# Patient Record
Sex: Male | Born: 1995 | Race: White | Hispanic: No | Marital: Single | Smoking: Former smoker
Health system: Southern US, Community
[De-identification: ages and names within clinical notes are randomized; demographics above are authoritative.]

## PROBLEM LIST (undated history)

## (undated) DIAGNOSIS — F319 Bipolar disorder, unspecified: Secondary | ICD-10-CM

## (undated) DIAGNOSIS — F419 Anxiety disorder, unspecified: Secondary | ICD-10-CM

## (undated) DIAGNOSIS — F410 Panic disorder [episodic paroxysmal anxiety] without agoraphobia: Secondary | ICD-10-CM

---
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
## 2016-04-11 DIAGNOSIS — Z87891 Personal history of nicotine dependence: Secondary | ICD-10-CM | POA: Insufficient documentation

## 2016-04-11 DIAGNOSIS — F410 Panic disorder [episodic paroxysmal anxiety] without agoraphobia: Secondary | ICD-10-CM | POA: Insufficient documentation

## 2016-04-11 DIAGNOSIS — Z5181 Encounter for therapeutic drug level monitoring: Secondary | ICD-10-CM | POA: Insufficient documentation

## 2016-04-12 ENCOUNTER — Encounter: Payer: Self-pay | Admitting: Emergency Medicine

## 2016-04-12 ENCOUNTER — Emergency Department
Admission: EM | Admit: 2016-04-12 | Discharge: 2016-04-12 | Disposition: A | Discharge status: Home / Self Care | Payer: Self-pay | Attending: Emergency Medicine | Admitting: Emergency Medicine

## 2016-04-12 ENCOUNTER — Emergency Department: Payer: Self-pay

## 2016-04-12 DIAGNOSIS — F410 Panic disorder [episodic paroxysmal anxiety] without agoraphobia: Secondary | ICD-10-CM

## 2016-04-12 DIAGNOSIS — F419 Anxiety disorder, unspecified: Secondary | ICD-10-CM

## 2016-04-12 HISTORY — DX: Anxiety disorder, unspecified: F41.9

## 2016-04-12 HISTORY — DX: Panic disorder (episodic paroxysmal anxiety): F41.0

## 2016-04-12 LAB — URINE DRUG SCREEN, QUALITATIVE (ARMC ONLY)
Amphetamines, Ur Screen: NOT DETECTED
BARBITURATES, UR SCREEN: NOT DETECTED
BENZODIAZEPINE, UR SCRN: NOT DETECTED
Cannabinoid 50 Ng, Ur ~~LOC~~: POSITIVE — AB
Cocaine Metabolite,Ur ~~LOC~~: NOT DETECTED
MDMA (Ecstasy)Ur Screen: NOT DETECTED
METHADONE SCREEN, URINE: NOT DETECTED
Opiate, Ur Screen: NOT DETECTED
Phencyclidine (PCP) Ur S: NOT DETECTED
TRICYCLIC, UR SCREEN: NOT DETECTED

## 2016-04-12 LAB — COMPREHENSIVE METABOLIC PANEL
ALK PHOS: 85 U/L (ref 38–126)
ALT: 18 U/L (ref 17–63)
ANION GAP: 9 (ref 5–15)
AST: 23 U/L (ref 15–41)
Albumin: 5.1 g/dL — ABNORMAL HIGH (ref 3.5–5.0)
BILIRUBIN TOTAL: 1.2 mg/dL (ref 0.3–1.2)
BUN: 12 mg/dL (ref 6–20)
CALCIUM: 9.5 mg/dL (ref 8.9–10.3)
CO2: 25 mmol/L (ref 22–32)
Chloride: 103 mmol/L (ref 101–111)
Creatinine, Ser: 0.86 mg/dL (ref 0.61–1.24)
GFR calc non Af Amer: >60 mL/min (ref >60)
Glucose, Bld: 101 mg/dL — ABNORMAL HIGH (ref 65–99)
Potassium: 3.5 mmol/L (ref 3.5–5.1)
Sodium: 137 mmol/L (ref 135–145)
TOTAL PROTEIN: 8.3 g/dL — AB (ref 6.5–8.1)

## 2016-04-12 LAB — CBC
HEMATOCRIT: 39.9 % — AB (ref 40.0–52.0)
HEMOGLOBIN: 14.6 g/dL (ref 13.0–18.0)
MCH: 33.9 pg (ref 26.0–34.0)
MCHC: 36.6 g/dL — AB (ref 32.0–36.0)
MCV: 92.7 fL (ref 80.0–100.0)
Platelets: 189 10*3/uL (ref 150–440)
RBC: 4.31 MIL/uL — ABNORMAL LOW (ref 4.40–5.90)
RDW: 13.1 % (ref 11.5–14.5)
WBC: 13.5 10*3/uL — ABNORMAL HIGH (ref 3.8–10.6)

## 2016-04-12 LAB — TROPONIN I

## 2016-04-12 MED ORDER — LORAZEPAM 1 MG PO TABS
1.0000 mg | ORAL_TABLET | Freq: Once | ORAL | Status: AC
  Administered 2016-04-12: 1 mg via ORAL
  Filled 2016-04-12: qty 1

## 2016-04-12 MED ORDER — ESCITALOPRAM OXALATE 20 MG PO TABS: 20.0000 mg | ORAL_TABLET | Freq: Every day | ORAL | 0 refills | Status: AC

## 2016-04-12 NOTE — ED Notes (Signed)
Pt states chest pain x 3 years, worse when he takes a breath. Pt states he quit smoking 2 days ago. Pt seems anxious.

## 2016-04-12 NOTE — ED Provider Notes (Signed)
[facility] Emergency Department Provider Note  ____________________________________________   First MD Initiated Contact with Patient 04/12/16 0041     (approximate)  I have reviewed the triage vital signs and the nursing notes.   HISTORY  Chief Complaint Chest Pain and Anxiety    HPI Bernard Ward is a 20 y.o. male presents with "my anxiety and depression and killing me". Patient states that tonight he had a episode where he felt as though he could not breathe bilateral hand numbness and tingling feeling like I was panicking". Patient states that he's been trying to self medicate marijuana for his anxiety however he has not smoked any marijuana in 3 days. Patient also admits to cocaine and LSD approximately one month ago. Patient states that he constantly thinks about death and possibly of cancer as well as multiple other medical ailments.   Past Medical History:  Diagnosis Date  . Anxiety   . Panic attacks     Patient Active Problem List   Diagnosis Date Noted  . MIGRAINE-COMMON 09/19/2007    History reviewed. No pertinent surgical history.  Prior to Admission medications   Not on File    Allergies Review of patient's allergies indicates no known allergies.  History reviewed. No pertinent family history.  Social History Social History  Substance Use Topics  . Smoking status: Current Every Day Smoker  . Smokeless tobacco: Former Neurosurgeon  . Alcohol use Not on file    Review of Systems Constitutional: No fever/chills Eyes: No visual changes. ENT: No sore throat. Cardiovascular: Denies chest pain. Respiratory: Denies shortness of breath. Gastrointestinal: No abdominal pain.  No nausea, no vomiting.  No diarrhea.  No constipation. Genitourinary: Negative for dysuria. Musculoskeletal: Negative for back pain. Skin: Negative for rash. Neurological: Negative for headaches, focal weakness or numbness.  10-point ROS otherwise  negative.  ____________________________________________   PHYSICAL EXAM:  VITAL SIGNS: ED Triage Vitals [04/12/16 0003]  Enc Vitals Group     BP 112/66     Pulse Rate 94     Resp 18     Temp 98 F (36.7 C)     Temp src      SpO2 95 %     Weight 115 lb (52.2 kg)     Height 5\' 8"  (1.727 m)     Head Circumference      Peak Flow      Pain Score 8     Pain Loc      Pain Edu?      Excl. in GC?     Constitutional: Alert and oriented. Well appearing and in no acute distress. Eyes: Conjunctivae are normal. PERRL. EOMI. Head: Atraumatic. Ears:  Healthy appearing ear canals and TMs bilaterally Nose: No congestion/rhinnorhea. Mouth/Throat: Mucous membranes are moist.  Oropharynx non-erythematous. Neck: No stridor.  No meningeal signs.  Cardiovascular: Normal rate, regular rhythm. Good peripheral circulation. Grossly normal heart sounds.   Respiratory: Normal respiratory effort.  No retractions. Lungs CTAB. Gastrointestinal: Soft and nontender. No distention.  Genitourinary:  Musculoskeletal: No lower extremity tenderness nor edema. No gross deformities of extremities. Neurologic:  Normal speech and language. No gross focal neurologic deficits are appreciated.  Skin:  Skin is warm, dry and intact. No rash noted. Psychiatric: Very anxious affect. Speech and behavior are normal.  ____________________________________________   LABS (all labs ordered are listed, but only abnormal results are displayed)  Labs Reviewed  CBC - Abnormal; Notable for the following:  Result Value   WBC 13.5 (*)    RBC 4.31 (*)    HCT 39.9 (*)    MCHC 36.6 (*)    All other components within normal limits  COMPREHENSIVE METABOLIC PANEL - Abnormal; Notable for the following:    Glucose, Bld 101 (*)    Total Protein 8.3 (*)    Albumin 5.1 (*)    All other components within normal limits  TROPONIN I  URINE DRUG SCREEN, QUALITATIVE ([facility] ONLY)    ____________________________________________  EKG  ED ECG REPORT I, [place] Jacalynn Buzzell, the attending physician, personally viewed and interpreted this ECG.   Date: 04/12/2016  EKG Time: 12:11 AM  Rate: 80  Rhythm: Normal sinus rhythm  Axis: Normal  Intervals: Normal  ST&T Change: N  ____________________________________________  RADIOLOGY I, [place] Dewayne Shorter, personally viewed and evaluated these images (plain radiographs) as part of my medical decision making, as well as reviewing the written report by the radiologist.  Dg Chest Portable 1 View  Result Date: 04/12/2016 CLINICAL DATA:  Acute onset back pain while getting up from bed. EXAM: PORTABLE CHEST 1 VIEW COMPARISON:  Chest radiograph October 24, 2014 FINDINGS: Cardiomediastinal silhouette is normal. The lungs are clear without pleural effusions or focal consolidations. Trachea projects midline and there is no pneumothorax. Soft tissue planes and included osseous structures are non-suspicious. IMPRESSION: Normal chest. Electronically Signed   By: Awilda Metro M.D.   On: 04/12/2016 01:53     Procedures   ____________________________________________   INITIAL IMPRESSION / ASSESSMENT AND PLAN / ED COURSE  Pertinent labs & imaging results that were available during my care of the patient were reviewed by me and considered in my medical decision making (see chart for details).    Clinical Course   History of physical exam consistent with acute panic attack. Patient EKG unremarkable as well as chest x-ray. Patient advised to discontinue marijuana and all illicit drug use and follow-up with Dr. Edwin Dada ____________________________________________  FINAL CLINICAL IMPRESSION(S) / ED DIAGNOSES  Final diagnoses:  Anxiety  Panic attack     MEDICATIONS GIVEN DURING THIS VISIT:  Medications  LORazepam (ATIVAN) tablet 1 mg (1 mg Oral Given 04/12/16 0157)     NEW OUTPATIENT MEDICATIONS STARTED DURING THIS  VISIT:  New Prescriptions   No medications on file      Note:  This document was prepared using Dragon voice recognition software and may include unintentional dictation errors.    Darci Current, MD 04/12/16 [phone]

## 2016-04-12 NOTE — ED Notes (Signed)
Dr. Manson Passey at bedside.  Pt states he was lying in bed, "I didn't feel right, I couldn't catch my breath." Pt stood up and states he passed out. Mom states he has anxiety. Mom states he has been off anxiety pills x 2 years because he didn't like the way he felt. Pt talking fast, repeating self, talking with hands, seems anxious.  States back pain, R arm pain, tingling in R arm and sweating of R hand when syncopal episode occurred.

## 2016-04-12 NOTE — ED Notes (Signed)
Pt ambulatory to toilet

## 2016-04-12 NOTE — ED Triage Notes (Signed)
Patient brought in complaining of chest pain. Patient states that he feels like an elephant is sitting on his chest. Patient states that his anxiety and depression are killing him. Denies SI.

## 2016-04-12 NOTE — Progress Notes (Signed)
TTS spoke with patient.  TTS gathered information from patient on his symptoms and recent behaviors.  He reports that he has anxiety and depression.  He reports not receiving psychiatric services.  He states that he has been receiving his medications from his primary care physician. He  Reports having an appointment with him tomorrow.  TTS provided information for [facility] an other care agencies to the patient.

## 2016-04-13 ENCOUNTER — Encounter: Payer: Self-pay | Admitting: Emergency Medicine

## 2016-04-13 ENCOUNTER — Emergency Department
Admission: EM | Admit: 2016-04-13 | Discharge: 2016-04-13 | Disposition: A | Discharge status: Home / Self Care | Payer: Self-pay | Attending: Emergency Medicine | Admitting: Emergency Medicine

## 2016-04-13 DIAGNOSIS — Z87891 Personal history of nicotine dependence: Secondary | ICD-10-CM | POA: Insufficient documentation

## 2016-04-13 DIAGNOSIS — J029 Acute pharyngitis, unspecified: Secondary | ICD-10-CM | POA: Insufficient documentation

## 2016-04-13 LAB — POCT RAPID STREP A: STREPTOCOCCUS, GROUP A SCREEN (DIRECT): NEGATIVE

## 2016-04-13 MED ORDER — AMOXICILLIN 500 MG PO TABS
500.0000 mg | ORAL_TABLET | Freq: Two times a day (BID) | ORAL | 0 refills | Status: DC
Start: 2016-04-13 — End: 2016-10-04

## 2016-04-13 MED ORDER — LIDOCAINE VISCOUS 2 % MT SOLN: 20.0000 mL | OROMUCOSAL | 0 refills | Status: DC | PRN

## 2016-04-13 MED ORDER — LIDOCAINE VISCOUS 2 % MT SOLN
15.0000 mL | Freq: Once | OROMUCOSAL | Status: AC
  Administered 2016-04-13: 15 mL via OROMUCOSAL
  Filled 2016-04-13: qty 15

## 2016-04-13 MED ORDER — AMOXICILLIN 500 MG PO CAPS
500.0000 mg | ORAL_CAPSULE | Freq: Once | ORAL | Status: AC
  Administered 2016-04-13: 500 mg via ORAL
  Filled 2016-04-13: qty 1

## 2016-04-13 NOTE — ED Provider Notes (Signed)
[facility] Emergency Department Provider Note  ____________________________________________  Time seen: Approximately 10:04 PM  I have reviewed the triage vital signs and the nursing notes.   HISTORY  Chief Complaint Sore Throat and Fever    HPI Bernard Ward is a 20 y.o. male presents for evaluation of sore throat times one day. Patient states he's had a low-grade fever at home. Hurts to swallow.   Past Medical History:  Diagnosis Date  . Anxiety   . Panic attacks     Patient Active Problem List   Diagnosis Date Noted  . MIGRAINE-COMMON 09/19/2007    History reviewed. No pertinent surgical history.  Prior to Admission medications   Medication Sig Start Date End Date Taking? Authorizing Provider  amoxicillin (AMOXIL) 500 MG tablet Take 1 tablet (500 mg total) by mouth 2 (two) times daily. 04/13/16   Charmayne Sheer Madalen Gavin, PA-C  escitalopram (LEXAPRO) 20 MG tablet Take 1 tablet (20 mg total) by mouth daily. 04/12/16 05/12/16  Darci Current, MD  lidocaine (XYLOCAINE) 2 % solution Use as directed 20 mLs in the mouth or throat as needed for mouth pain. 04/13/16   Evangeline Dakin, PA-C    Allergies Review of patient's allergies indicates no known allergies.  No family history on file.  Social History Social History  Substance Use Topics  . Smoking status: Former Smoker    Types: Cigarettes  . Smokeless tobacco: Former Neurosurgeon     Comment: currently using vapor cigarettes only  . Alcohol use No    Review of Systems Constitutional: No fever/chills ENT: Positive sore throat Cardiovascular: Denies chest pain. Respiratory: Denies shortness of breath. Musculoskeletal: Negative for back pain. Skin: Negative for rash. Neurological: Negative for headaches, focal weakness or numbness.  10-point ROS otherwise negative.  ____________________________________________   PHYSICAL EXAM:  VITAL SIGNS: ED Triage Vitals  Enc Vitals Group     BP 04/13/16  2134 123/81     Pulse Rate 04/13/16 2134 80     Resp 04/13/16 2134 18     Temp 04/13/16 2134 98.7 F (37.1 C)     Temp Source 04/13/16 2134 Oral     SpO2 04/13/16 2134 99 %     Weight 04/13/16 2134 115 lb (52.2 kg)     Height 04/13/16 2134  (1.727 m)     Head Circumference --      Peak Flow --      Pain Score 04/13/16 2135 10     Pain Loc --      Pain Edu? --      Excl. in GC? --     Constitutional: Alert and oriented. Well appearing and in no acute distress. Nose: No congestion/rhinnorhea. Mouth/Throat: Mucous membranes are moist.  Oropharynx erythematousWith mild tonsillar enlargement. No exudate noted.. Neck: No stridor. Supple, full range of motion.  Cardiovascular: Normal rate, regular rhythm. Grossly normal heart sounds.  Good peripheral circulation. Respiratory: Normal respiratory effort.  No retractions. Lungs CTAB. Musculoskeletal: No lower extremity tenderness nor edema.  No joint effusions. Neurologic:  Normal speech and language. No gross focal neurologic deficits are appreciated. No gait instability. Skin:  Skin is warm, dry and intact. No rash noted. Psychiatric: Mood and affect are normal. Speech and behavior are normal.  ____________________________________________   LABS (all labs ordered are listed, but only abnormal results are displayed)  Labs Reviewed  CULTURE, GROUP A STREP [facility])  POCT RAPID STREP A   ____________________________________________  EKG   ____________________________________________  RADIOLOGY   ____________________________________________   PROCEDURES  Procedure(s) performed: None  Critical Care performed: No  ____________________________________________   INITIAL IMPRESSION / ASSESSMENT AND PLAN / ED COURSE  Pertinent labs & imaging results that were available during my care of the patient were reviewed by me and considered in my medical decision making (see chart for details).  Acute pharyngitis. Rx given for  amoxicillin and viscous lidocaine. Patient follow-up with PCP or return to ER with any worsening symptomology.  Clinical Course    ____________________________________________   FINAL CLINICAL IMPRESSION(S) / ED DIAGNOSES  Final diagnoses:  Sore throat  Acute pharyngitis, unspecified etiology     This chart was dictated using voice recognition software/Dragon. Despite best efforts to proofread, errors can occur which can change the meaning. Any change was purely unintentional.    Evangeline Dakin, PA-C 04/13/16 2233    Phineas Semen, MD 04/13/16 [phone]

## 2016-04-13 NOTE — ED Triage Notes (Signed)
Patient states was seen in ED a few days ago for anxiety.  Yesterday felt like throat was sore.  Today just feeling weak and feverish.

## 2016-04-13 NOTE — ED Notes (Signed)
Pt states throat is hurting, states he is not able to swallow food and it hurts to swallow liquid. "Marveen Reeks gave me an amoxicillin." Pt friend states he has had some dizziness.   Hx of anxiety, seen for anxiety Monday night, started on new medicine that pt states is helping.

## 2016-04-16 LAB — CULTURE, GROUP A STREP (THRC)

## 2016-10-02 ENCOUNTER — Emergency Department
Admission: EM | Admit: 2016-10-02 | Discharge: 2016-10-02 | Disposition: A | Discharge status: Home / Self Care | Payer: Self-pay | Attending: Emergency Medicine | Admitting: Emergency Medicine

## 2016-10-02 DIAGNOSIS — Z87891 Personal history of nicotine dependence: Secondary | ICD-10-CM | POA: Insufficient documentation

## 2016-10-02 DIAGNOSIS — B349 Viral infection, unspecified: Secondary | ICD-10-CM | POA: Insufficient documentation

## 2016-10-02 LAB — CBC
HCT: 43.2 % (ref 40.0–52.0)
HEMOGLOBIN: 15.6 g/dL (ref 13.0–18.0)
MCH: 33.9 pg (ref 26.0–34.0)
MCHC: 36 g/dL (ref 32.0–36.0)
MCV: 94.1 fL (ref 80.0–100.0)
Platelets: 203 10*3/uL (ref 150–440)
RBC: 4.59 MIL/uL (ref 4.40–5.90)
RDW: 13.2 % (ref 11.5–14.5)
WBC: 10 10*3/uL (ref 3.8–10.6)

## 2016-10-02 LAB — COMPREHENSIVE METABOLIC PANEL
ALT: 20 U/L (ref 17–63)
ANION GAP: 9 (ref 5–15)
AST: 27 U/L (ref 15–41)
Albumin: 4.5 g/dL (ref 3.5–5.0)
Alkaline Phosphatase: 86 U/L (ref 38–126)
BUN: 15 mg/dL (ref 6–20)
CHLORIDE: 103 mmol/L (ref 101–111)
CO2: 24 mmol/L (ref 22–32)
Calcium: 9.2 mg/dL (ref 8.9–10.3)
Creatinine, Ser: 0.81 mg/dL (ref 0.61–1.24)
GFR calc non Af Amer: >60 mL/min (ref >60)
Glucose, Bld: 104 mg/dL — ABNORMAL HIGH (ref 65–99)
Potassium: 3.9 mmol/L (ref 3.5–5.1)
SODIUM: 136 mmol/L (ref 135–145)
Total Bilirubin: 1 mg/dL (ref 0.3–1.2)
Total Protein: 7.7 g/dL (ref 6.5–8.1)

## 2016-10-02 LAB — LIPASE, BLOOD: LIPASE: 18 U/L (ref 11–51)

## 2016-10-02 MED ORDER — KETOROLAC TROMETHAMINE 30 MG/ML IJ SOLN
30.0000 mg | Freq: Once | INTRAMUSCULAR | Status: AC
  Administered 2016-10-02: 30 mg via INTRAVENOUS
  Filled 2016-10-02: qty 1

## 2016-10-02 MED ORDER — ONDANSETRON 4 MG PO TBDP
4.0000 mg | ORAL_TABLET | Freq: Once | ORAL | Status: AC | PRN
  Administered 2016-10-02: 4 mg via ORAL

## 2016-10-02 MED ORDER — SODIUM CHLORIDE 0.9 % IV BOLUS (SEPSIS)
1000.0000 mL | Freq: Once | INTRAVENOUS | Status: AC
Start: 2016-10-02 — End: 2016-10-02
  Administered 2016-10-02: 1000 mL via INTRAVENOUS

## 2016-10-02 MED ORDER — LOPERAMIDE HCL 2 MG PO TABS: 2.0000 mg | ORAL_TABLET | Freq: Four times a day (QID) | ORAL | 0 refills | Status: DC | PRN

## 2016-10-02 MED ORDER — ONDANSETRON HCL 4 MG/2ML IJ SOLN
4.0000 mg | Freq: Once | INTRAMUSCULAR | Status: AC
  Administered 2016-10-02: 4 mg via INTRAVENOUS
  Filled 2016-10-02: qty 2

## 2016-10-02 MED ORDER — ONDANSETRON 4 MG PO TBDP
ORAL_TABLET | ORAL | Status: AC
  Filled 2016-10-02: qty 1

## 2016-10-02 MED ORDER — PROMETHAZINE HCL 25 MG PO TABS: 25.0000 mg | ORAL_TABLET | Freq: Four times a day (QID) | ORAL | 0 refills | Status: DC | PRN

## 2016-10-02 NOTE — ED Triage Notes (Signed)
Pt c/o cough, body aches, congestion, fatigue and diarrhea X 2 days.

## 2016-10-02 NOTE — Discharge Instructions (Signed)
Please use Tylenol or Motrin every 6 hours as needed for discomfort or fever. Please drink plenty of fluids and take a nausea medication as needed, as written. You have also been prescribed an antidiarrheal medication starting tomorrow please take this medication as needed for diarrhea. Obtain plenty of rest, drink plenty of fluids, follow-up with your primary care doctor in 2-3 days for recheck/reevaluation unless improved. Return to the emergency department for any significant abdominal pain, unable to keep down fluids to keep vomiting, or any other symptom personally concerning to yourself.

## 2016-10-02 NOTE — ED Notes (Signed)
Pt verbalized understanding of discharge instructions. NAD at this time. 

## 2016-10-02 NOTE — ED Provider Notes (Signed)
[facility] Emergency Department Provider Note  Time seen: 5:09 PM  I have reviewed the triage vital signs and the nursing notes.   HISTORY  Chief Complaint Generalized Body Aches; URI; Cough; and Diarrhea    HPI Bernard Ward is a 21 y.o. male with a past medical history of anxiety who presents to the emergency department with abdominal discomfort nausea vomiting and diarrhea. According to the patient for the past 2 days he has felt very weak and fatigued. This morning he thought a burning sensation in his abdomen followed by several episodes of nausea and vomiting and since then has developed diarrhea. States diffuse abdominal cramping discomfort. Denies any chest pain or trouble breathing. Denies any fever.  Past Medical History:  Diagnosis Date  . Anxiety   . Panic attacks     Patient Active Problem List   Diagnosis Date Noted  . MIGRAINE-COMMON 09/19/2007    History reviewed. No pertinent surgical history.  Prior to Admission medications   Medication Sig Start Date End Date Taking? Authorizing Provider  amoxicillin (AMOXIL) 500 MG tablet Take 1 tablet (500 mg total) by mouth 2 (two) times daily. 04/13/16   Charmayne Sheer Beers, PA-C  escitalopram (LEXAPRO) 20 MG tablet Take 1 tablet (20 mg total) by mouth daily. 04/12/16 05/12/16  Darci Current, MD  lidocaine (XYLOCAINE) 2 % solution Use as directed 20 mLs in the mouth or throat as needed for mouth pain. 04/13/16   Evangeline Dakin, PA-C    No Known Allergies  No family history on file.  Social History Social History  Substance Use Topics  . Smoking status: Former Smoker    Types: Cigarettes  . Smokeless tobacco: Former Neurosurgeon     Comment: currently using vapor cigarettes only  . Alcohol use No    Review of Systems Constitutional: Negative for fever. Cardiovascular: Negative for chest pain. Respiratory: Negative for shortness of breath. Gastrointestinal:Positive for abdominal cramping, nausea  vomiting and diarrhea. Neurological: Negative for headache 10-point ROS otherwise negative.  ____________________________________________   PHYSICAL EXAM:  VITAL SIGNS: ED Triage Vitals  Enc Vitals Group     BP 10/02/16 1542 117/78     Pulse Rate 10/02/16 1542 80     Resp 10/02/16 1542 18     Temp 10/02/16 1542 98.2 F (36.8 C)     Temp Source 10/02/16 1542 Oral     SpO2 10/02/16 1542 99 %     Weight 10/02/16 1542 120 lb (54.4 kg)     Height 10/02/16 1542 5\' 10"  (1.778 m)     Head Circumference --      Peak Flow --      Pain Score 10/02/16 1543 8     Pain Loc --      Pain Edu? --      Excl. in GC? --     Constitutional: Alert and oriented. Well appearing and in no distress. Eyes: Normal exam ENT   Head: Normocephalic and atraumatic.   Mouth/Throat: Mucous membranes are moist. Cardiovascular: Normal rate, regular rhythm. No murmur Respiratory: Normal respiratory effort without tachypnea nor retractions. Breath sounds are clear Gastrointestinal: Soft, mild diffuse abdominal tenderness, no focal tenderness identified. No distention. Hyperactive bowel sounds. Musculoskeletal: Nontender with normal range of motion in all extremities Neurologic:  Normal speech and language. No gross focal neurologic deficits Skin:  Skin is warm, dry and intact.  Psychiatric: Mood and affect are normal.   ____________________________________________    INITIAL IMPRESSION / ASSESSMENT  AND PLAN / ED COURSE  Pertinent labs & imaging results that were available during my care of the patient were reviewed by me and considered in my medical decision making (see chart for details).  The patient presents to the emergency department with nausea vomiting diarrhea and abdominal cramping. Patient's symptoms are very suggestive of a viral process such as nor a virus. No fever, afebrile in the emergency department. Labs are largely within normal limits. We will treat symptomatically with fluids,  Zofran and Toradol in the emergency department. Anticipate likely discharge home with loperamide and Phenergan. I discussed supportive care at home as well as by normal abdominal pain return precautions.  Patient continues to appear well, better after medications. We will discharge home with supportive care.  ____________________________________________   FINAL CLINICAL IMPRESSION(S) / ED DIAGNOSES  Viral syndrome    Minna AntisKevin Nathanal Hermiz, MD 10/02/16 [phone]

## 2016-10-03 ENCOUNTER — Telehealth: Payer: Self-pay

## 2016-10-03 NOTE — Telephone Encounter (Signed)
Left message to call office to see how he was doing after his ER visit for weakness

## 2016-10-04 ENCOUNTER — Emergency Department: Payer: Self-pay

## 2016-10-04 ENCOUNTER — Encounter: Payer: Self-pay | Admitting: Emergency Medicine

## 2016-10-04 ENCOUNTER — Emergency Department
Admission: EM | Admit: 2016-10-04 | Discharge: 2016-10-04 | Disposition: A | Discharge status: Home / Self Care | Payer: Self-pay | Attending: Emergency Medicine | Admitting: Emergency Medicine

## 2016-10-04 DIAGNOSIS — J40 Bronchitis, not specified as acute or chronic: Secondary | ICD-10-CM | POA: Insufficient documentation

## 2016-10-04 DIAGNOSIS — Z87891 Personal history of nicotine dependence: Secondary | ICD-10-CM | POA: Insufficient documentation

## 2016-10-04 LAB — POCT RAPID STREP A: Streptococcus, Group A Screen (Direct): NEGATIVE

## 2016-10-04 LAB — INFLUENZA PANEL BY PCR (TYPE A & B)
Influenza A By PCR: NEGATIVE
Influenza B By PCR: NEGATIVE

## 2016-10-04 MED ORDER — PREDNISONE 10 MG (21) PO TBPK: ORAL_TABLET | ORAL | 0 refills | Status: AC

## 2016-10-04 MED ORDER — DOXYCYCLINE HYCLATE 50 MG PO CAPS: 100.0000 mg | ORAL_CAPSULE | Freq: Two times a day (BID) | ORAL | 0 refills | Status: AC

## 2016-10-04 NOTE — ED Notes (Signed)
Pt discharged home after verbalizing understanding of discharge instructions; nad noted. 

## 2016-10-04 NOTE — ED Provider Notes (Signed)
[facility] Emergency Department Provider Note  ____________________________________________  Time seen: Approximately 3:57 PM  I have reviewed the triage vital signs and the nursing notes.   HISTORY  Chief Complaint Sore Throat    HPI Bernard Ward is a 21 y.o. male that presents to emergency department with sore throat, cough for 1 week. Patient is concerned for strep throat as he had it over the summer and had a peritonsillar abscess that needed to be drained over the summer. Patient has not taken anything for symptoms. Patient states that he was vomiting and had diarrhea a couple days ago, but this has resolved. Patient denies fever, voice change, chills, muscle aches, difficulty swallowing, shortness of breath, chest pain.   Past Medical History:  Diagnosis Date  . Anxiety   . Panic attacks     Patient Active Problem List   Diagnosis Date Noted  . MIGRAINE-COMMON 09/19/2007    History reviewed. No pertinent surgical history.  Prior to Admission medications   Medication Sig Start Date End Date Taking? Authorizing Provider  doxycycline (VIBRAMYCIN) 50 MG capsule Take 2 capsules (100 mg total) by mouth 2 (two) times daily. 10/04/16 10/11/16  Enid DerryAshley Jad Johansson, PA-C  escitalopram (LEXAPRO) 20 MG tablet Take 1 tablet (20 mg total) by mouth daily. 04/12/16 05/12/16  Darci Currentandolph N Brown, MD  predniSONE (STERAPRED UNI-PAK 21 TAB) 10 MG (21) TBPK tablet Take 6 tablets on day 1, take 5 tablets on day 2, take 4 tablets on day 3, take 3 tablets on day 4, take 2 tablets on day 5, take 1 tablet on day 6 10/04/16   Enid DerryAshley Emmarae Cowdery, PA-C    Allergies Patient has no known allergies.  No family history on file.  Social History Social History  Substance Use Topics  . Smoking status: Former Smoker    Types: Cigarettes  . Smokeless tobacco: Former NeurosurgeonUser     Comment: currently using vapor cigarettes only  . Alcohol use No     Review of Systems  Constitutional: No  fever/chills Cardiovascular: No chest pain. Respiratory:  No SOB. Gastrointestinal: No abdominal pain.  No nausea, no vomiting.  Musculoskeletal: Negative for musculoskeletal pain. Skin: Negative for rash, abrasions, lacerations, ecchymosis. Neurological: Negative for headaches, numbness or tingling   ____________________________________________   PHYSICAL EXAM:  VITAL SIGNS: ED Triage Vitals [10/04/16 1342]  Enc Vitals Group     BP 98/61     Pulse Rate 80     Resp 16     Temp 98.4 F (36.9 C)     Temp Source Oral     SpO2 99 %     Weight 119 lb (54 kg)     Height 5\' 9"  (1.753 m)     Head Circumference      Peak Flow      Pain Score 8     Pain Loc      Pain Edu?      Excl. in GC?      Constitutional: Alert and oriented. Well appearing and in no acute distress. Eyes: Conjunctivae are normal. PERRL. EOMI. Head: Atraumatic. ENT:      Ears:      Nose: No congestion/rhinnorhea.      Mouth/Throat: Mucous membranes are moist. Tonsils not enlarged. No hypertrophy. Tonsils are symmetric. Uvula midline. Oropharynx non-erythematous. No exudates. Soft palate rises symmetrically.  Neck: No stridor.   Cardiovascular: Normal rate, regular rhythm. Normal S1 and S2.  Good peripheral circulation. Respiratory: Normal respiratory effort without tachypnea  or retractions. Lungs CTAB. Good air entry to the bases with no decreased or absent breath sounds. Gastrointestinal: Bowel sounds 4 quadrants. Soft and nontender to palpation. No guarding or rigidity. No palpable masses. No distention. Musculoskeletal: Full range of motion to all extremities. No gross deformities appreciated. Neurologic:  Normal speech and language. No gross focal neurologic deficits are appreciated.  Skin:  Skin is warm, dry and intact. No rash noted.   ____________________________________________   LABS (all labs ordered are listed, but only abnormal results are displayed)  Labs Reviewed  INFLUENZA PANEL BY  PCR (TYPE A & B)  POCT RAPID STREP A   ____________________________________________  EKG   ____________________________________________  RADIOLOGY Lexine Baton, personally viewed and evaluated these images (plain radiographs) as part of my medical decision making, as well as reviewing the written report by the radiologist.  Dg Chest 2 View  Result Date: 10/04/2016 CLINICAL DATA:  Sore throat. History of tonsillar abscess. Right-sided chest pain EXAM: CHEST  2 VIEW COMPARISON:  April 12, 2016 FINDINGS: Lungs are clear. Heart size and pulmonary vascularity are normal. No adenopathy. No pneumothorax. No bone lesions. There is slight lower thoracic levoscoliosis. IMPRESSION: No edema or consolidation. Electronically Signed   By: Bretta Bang III M.D.   On: 10/04/2016 15:01    ____________________________________________    PROCEDURES  Procedure(s) performed:    Procedures    Medications - No data to display   ____________________________________________   INITIAL IMPRESSION / ASSESSMENT AND PLAN / ED COURSE  Pertinent labs & imaging results that were available during my care of the patient were reviewed by me and considered in my medical decision making (see chart for details).  Review of the [place] CSRS was performed in accordance of the [facility] prior to dispensing any controlled drugs.     Patient's diagnosis is consistent with bronchitis. Vital signs and exam are reassuring. Patient was seen in emergency department 2 days ago for vomiting and diarrhea. Per ED note, patient denied respiratory symptoms. Patient today is adamant that cough and sore throat has been going on one week. No indication of peritonsillar abscess. Patient will be discharged home with prescriptions for doxycycline and prednisone. Patient is to follow up with PCP as directed. Patient is given ED precautions to return to the ED for any worsening or new  symptoms.     ____________________________________________  FINAL CLINICAL IMPRESSION(S) / ED DIAGNOSES  Final diagnoses:  Bronchitis      NEW MEDICATIONS STARTED DURING THIS VISIT:  Discharge Medication List as of 10/04/2016  3:43 PM    START taking these medications   Details  doxycycline (VIBRAMYCIN) 50 MG capsule Take 2 capsules (100 mg total) by mouth 2 (two) times daily., Starting Tue 10/04/2016, Until Tue 10/11/2016, Print    predniSONE (STERAPRED UNI-PAK 21 TAB) 10 MG (21) TBPK tablet Take 6 tablets on day 1, take 5 tablets on day 2, take 4 tablets on day 3, take 3 tablets on day 4, take 2 tablets on day 5, take 1 tablet on day 6, Print            This chart was dictated using voice recognition software/Dragon. Despite best efforts to proofread, errors can occur which can change the meaning. Any change was purely unintentional.    Enid Derry, PA-C 10/04/16 1605    Phineas Semen, MD 10/05/16 1407

## 2016-10-04 NOTE — ED Triage Notes (Signed)
Sore throat three days ago with hx of tonsillar abscess.

## 2016-10-05 ENCOUNTER — Telehealth: Payer: Self-pay

## 2016-10-05 NOTE — Telephone Encounter (Signed)
Left message to see how he is doing after his 2nd trip to the ER this week for Bronchitis.

## 2016-10-08 ENCOUNTER — Emergency Department
Admission: EM | Admit: 2016-10-08 | Discharge: 2016-10-08 | Disposition: A | Discharge status: Home / Self Care | Payer: Self-pay | Attending: Emergency Medicine | Admitting: Emergency Medicine

## 2016-10-08 ENCOUNTER — Encounter: Payer: Self-pay | Admitting: Emergency Medicine

## 2016-10-08 ENCOUNTER — Emergency Department: Payer: Self-pay

## 2016-10-08 DIAGNOSIS — F17290 Nicotine dependence, other tobacco product, uncomplicated: Secondary | ICD-10-CM | POA: Insufficient documentation

## 2016-10-08 DIAGNOSIS — F419 Anxiety disorder, unspecified: Secondary | ICD-10-CM | POA: Insufficient documentation

## 2016-10-08 DIAGNOSIS — R0789 Other chest pain: Secondary | ICD-10-CM

## 2016-10-08 DIAGNOSIS — Z79899 Other long term (current) drug therapy: Secondary | ICD-10-CM | POA: Insufficient documentation

## 2016-10-08 HISTORY — DX: Bipolar disorder, unspecified: F31.9

## 2016-10-08 LAB — CBC
HEMATOCRIT: 39.8 % — AB (ref 40.0–52.0)
Hemoglobin: 14.3 g/dL (ref 13.0–18.0)
MCH: 33.2 pg (ref 26.0–34.0)
MCHC: 35.8 g/dL (ref 32.0–36.0)
MCV: 92.6 fL (ref 80.0–100.0)
Platelets: 232 10*3/uL (ref 150–440)
RBC: 4.3 MIL/uL — ABNORMAL LOW (ref 4.40–5.90)
RDW: 13 % (ref 11.5–14.5)
WBC: 7.1 10*3/uL (ref 3.8–10.6)

## 2016-10-08 LAB — BASIC METABOLIC PANEL
Anion gap: 7 (ref 5–15)
BUN: 15 mg/dL (ref 6–20)
CO2: 27 mmol/L (ref 22–32)
Calcium: 9.2 mg/dL (ref 8.9–10.3)
Chloride: 104 mmol/L (ref 101–111)
Creatinine, Ser: 0.96 mg/dL (ref 0.61–1.24)
GFR calc Af Amer: >60 mL/min (ref >60)
GLUCOSE: 85 mg/dL (ref 65–99)
Potassium: 3.7 mmol/L (ref 3.5–5.1)
Sodium: 138 mmol/L (ref 135–145)

## 2016-10-08 LAB — TROPONIN I: Troponin I: <0.03 ng/mL (ref <0.03)

## 2016-10-08 NOTE — ED Provider Notes (Signed)
[facility] Emergency Department Provider Note  ____________________________________________   I have reviewed the triage vital signs and the nursing notes.   HISTORY  Chief Complaint I had a panic attack and then I used google   HPI Bernard Ward is a 21 y.o. male with a history of panic attacks tells me that he had a panic attack tonight. He was almost asleep he became very anxious, he took some deep breaths it seemed to help but then he got on the Internet and found that he might be having a heart attack and he came in. Patient has no ongoing symptoms would like to go home. He had a diffuse chest pain "all over" which is same as these had multiple different times with multiple different panic attacks. He does take medication for this. He is not currently having a panic attack, he states this is a very common experience for him. He denies any personal or family history of PE or DVT no recent travel no leg swelling, he denies any young cardiac history in his family he denies cocaine abuse and he has no other complaints.       Past Medical History:  Diagnosis Date  . Anxiety   . Bipolar 1 disorder (HCC)   . Panic attacks     Patient Active Problem List   Diagnosis Date Noted  . MIGRAINE-COMMON 09/19/2007    History reviewed. No pertinent surgical history.  Prior to Admission medications   Medication Sig Start Date End Date Taking? Authorizing Provider  doxycycline (VIBRAMYCIN) 50 MG capsule Take 2 capsules (100 mg total) by mouth 2 (two) times daily. 10/04/16 10/11/16  Enid Derry, PA-C  escitalopram (LEXAPRO) 20 MG tablet Take 1 tablet (20 mg total) by mouth daily. 04/12/16 05/12/16  Darci Current, MD  predniSONE (STERAPRED UNI-PAK 21 TAB) 10 MG (21) TBPK tablet Take 6 tablets on day 1, take 5 tablets on day 2, take 4 tablets on day 3, take 3 tablets on day 4, take 2 tablets on day 5, take 1 tablet on day 6 10/04/16   Enid Derry, PA-C     Allergies Patient has no known allergies.  No family history on file.  Social History Social History  Substance Use Topics  . Smoking status: Former Smoker    Types: Cigarettes  . Smokeless tobacco: Former Neurosurgeon     Comment: currently using vapor cigarettes only  . Alcohol use No    Review of Systems Constitutional: No fever/chills Eyes: No visual changes. ENT: No sore throat. No stiff neck no neck pain Cardiovascular:  history of present illness. Respiratory: Denies shortness of breath. Gastrointestinal:   no vomiting.  No diarrhea.  No constipation. Genitourinary: Negative for dysuria. Musculoskeletal: Negative lower extremity swelling Skin: Negative for rash. Neurological: Negative for severe headaches, focal weakness or numbness. 10-point ROS otherwise negative.  ____________________________________________   PHYSICAL EXAM:  VITAL SIGNS: ED Triage Vitals  Enc Vitals Group     BP 10/08/16 0231 113/66     Pulse Rate 10/08/16 0231 75     Resp 10/08/16 0231 16     Temp 10/08/16 0231 97.6 F (36.4 C)     Temp Source 10/08/16 0231 Oral     SpO2 10/08/16 0231 99 %     Weight --      Height 10/08/16 0234 5\' 10"  (1.778 m)     Head Circumference --      Peak Flow --  Pain Score 10/08/16 0248 7     Pain Loc --      Pain Edu? --      Excl. in GC? --     Constitutional: Alert and oriented. Well appearing and in no acute distress.She is mildly anxious Eyes: Conjunctivae are normal. PERRL. EOMI. Head: Atraumatic. Nose: No congestion/rhinnorhea. Mouth/Throat: Mucous membranes are moist.  Oropharynx non-erythematous. Neck: No stridor.   Nontender with no meningismus Cardiovascular: Normal rate, regular rhythm. Grossly normal heart sounds.  Good peripheral circulation. Respiratory: Normal respiratory effort.  No retractions. Lungs CTAB. Abdominal: Soft and nontender. No distention. No guarding no rebound Back:  There is no focal tenderness or step off.  there  is no midline tenderness there are no lesions noted. there is no CVA tenderness Musculoskeletal: No lower extremity tenderness, no upper extremity tenderness. No joint effusions, no DVT signs strong distal pulses no edema Neurologic:  Normal speech and language. No gross focal neurologic deficits are appreciated.  Skin:  Skin is warm, dry and intact. No rash noted. Psychiatric: Mood and affect are normal. Speech and behavior are normal.  ____________________________________________   LABS (all labs ordered are listed, but only abnormal results are displayed)  Labs Reviewed  CBC - Abnormal; Notable for the following:       Result Value   RBC 4.30 (*)    HCT 39.8 (*)    All other components within normal limits  BASIC METABOLIC PANEL  TROPONIN I   ____________________________________________  EKG  I personally interpreted any EKGs ordered by me or triage Sinus rhythm rate is 32 bpm no acute ST elevation or depression normal axis unremarkable EKG ____________________________________________  RADIOLOGY  I reviewed any imaging ordered by me or triage that were performed during my shift and, if possible, patient and/or family made aware of any abnormal findings. ____________________________________________   PROCEDURES  Procedure(s) performed: None  Procedures  Critical Care performed: None  ____________________________________________   INITIAL IMPRESSION / ASSESSMENT AND PLAN / ED COURSE  Pertinent labs & imaging results that were available during my care of the patient were reviewed by me and considered in my medical decision making (see chart for details).  Patient with what he reports to be at panic attack. No ongoing symptoms workup negative here. Patient would prefer not to have a second set of cardiac enzymes which I don't think is unreasonable.At this time, there does not appear to be clinical evidence to support the diagnosis of pulmonary embolus, dissection,  myocarditis, endocarditis, pericarditis, pericardial tamponade, acute coronary syndrome, pneumothorax, pneumonia, or any other acute intrathoracic pathology that will require admission or acute intervention. Nor is there evidence of any significant intra-abdominal pathology causing this discomfort. Return precautions and follow-up given and understood    ____________________________________________   FINAL CLINICAL IMPRESSION(S) / ED DIAGNOSES  Final diagnoses:  None      This chart was dictated using voice recognition software.  Despite best efforts to proofread,  errors can occur which can change meaning.      Jeanmarie PlantJames A Harvel Meskill, MD 10/08/16 [phone]

## 2016-10-08 NOTE — ED Triage Notes (Signed)
Pt states that he woke up around 0030 with chest pain that is covering his entire chest. Pt denies N/V. Pt states that he has hx of panic attacks. Pt is in NAD at this time.

## 2017-05-01 IMAGING — CR DG CHEST 2V
2 series · 2 of 2 positions shown · non-contrast
Comparison: Prior radiograph from 10/04/2016.

CLINICAL DATA: Initial evaluation for acute diffuse chest pain.

EXAM:
CHEST  2 VIEW

[chest pa]
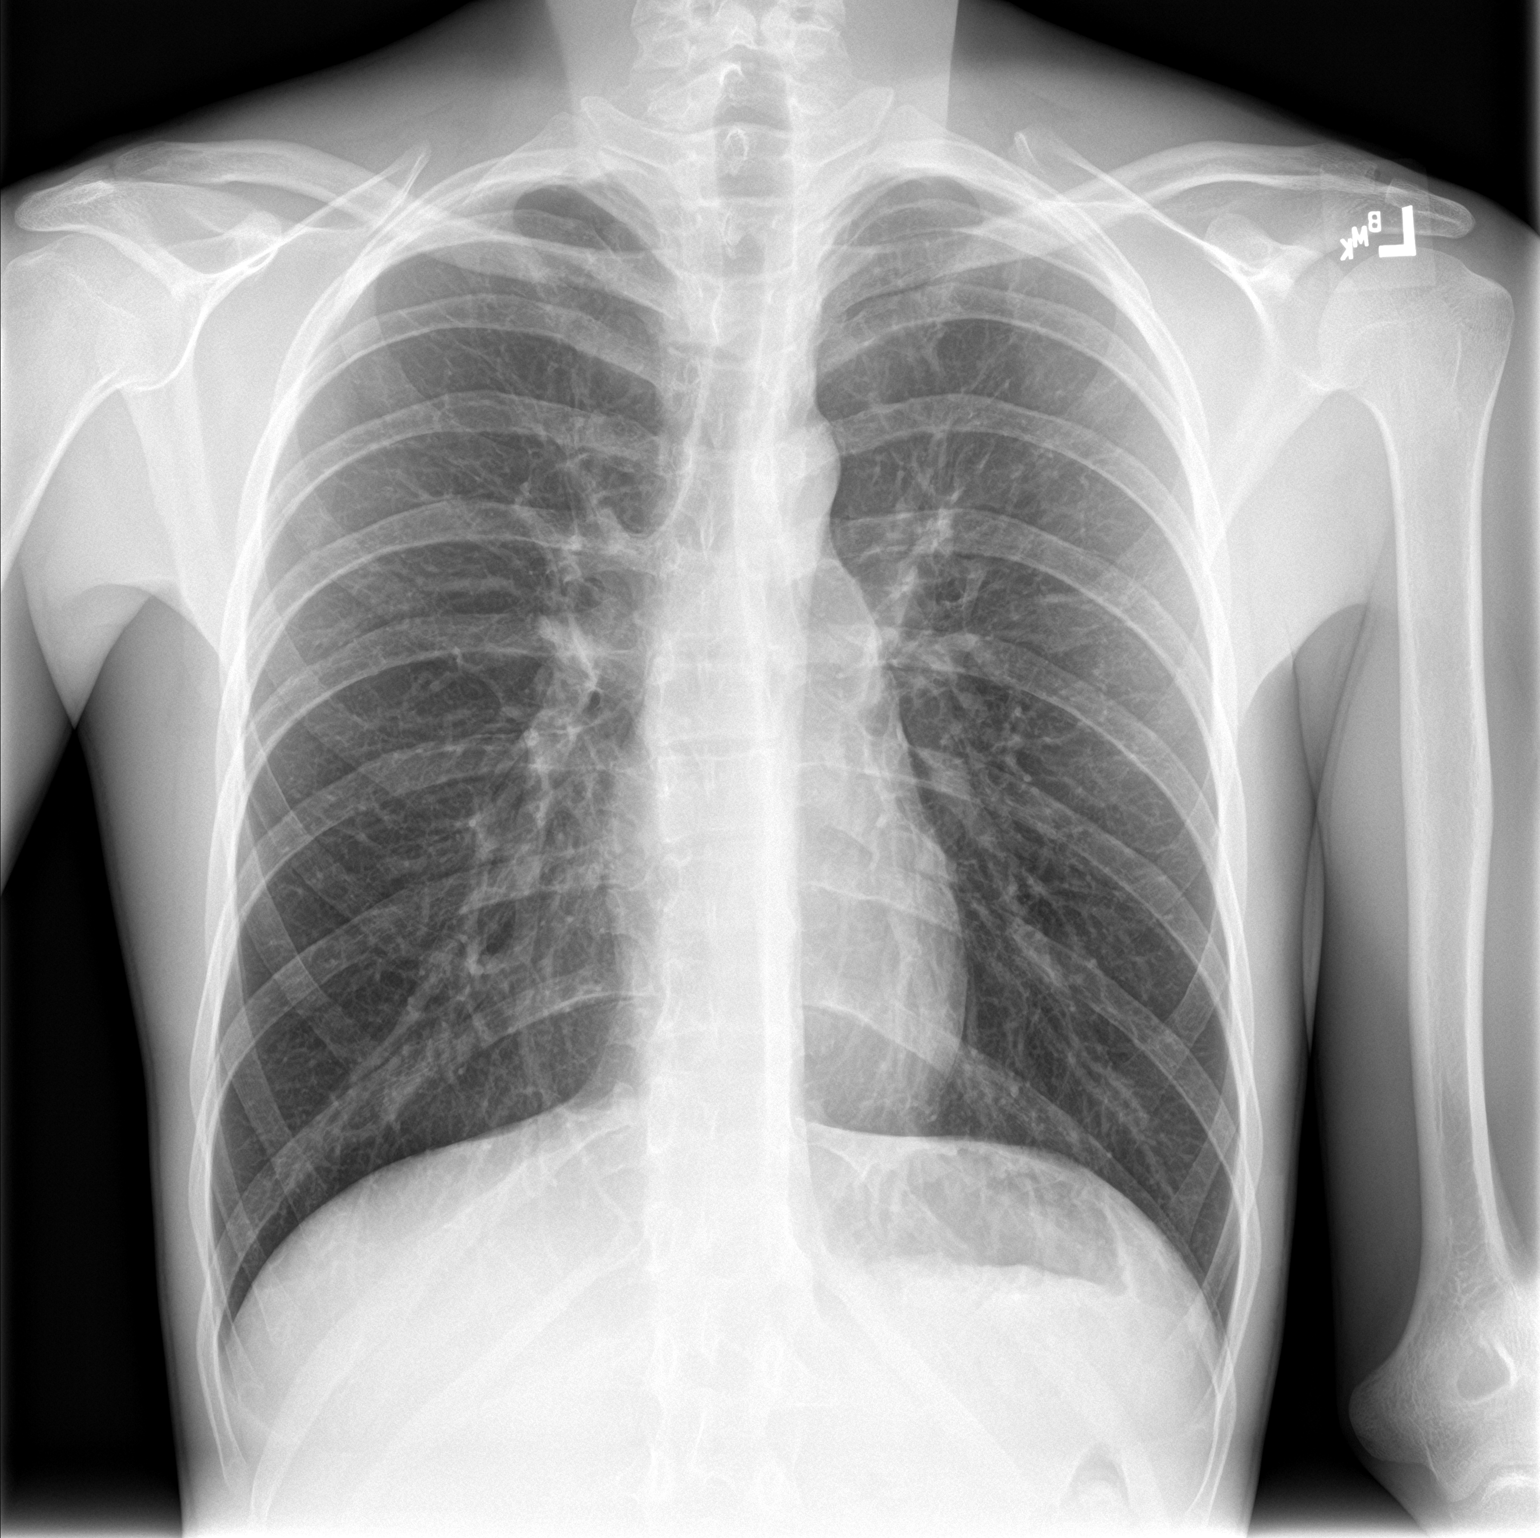

[chest lat]
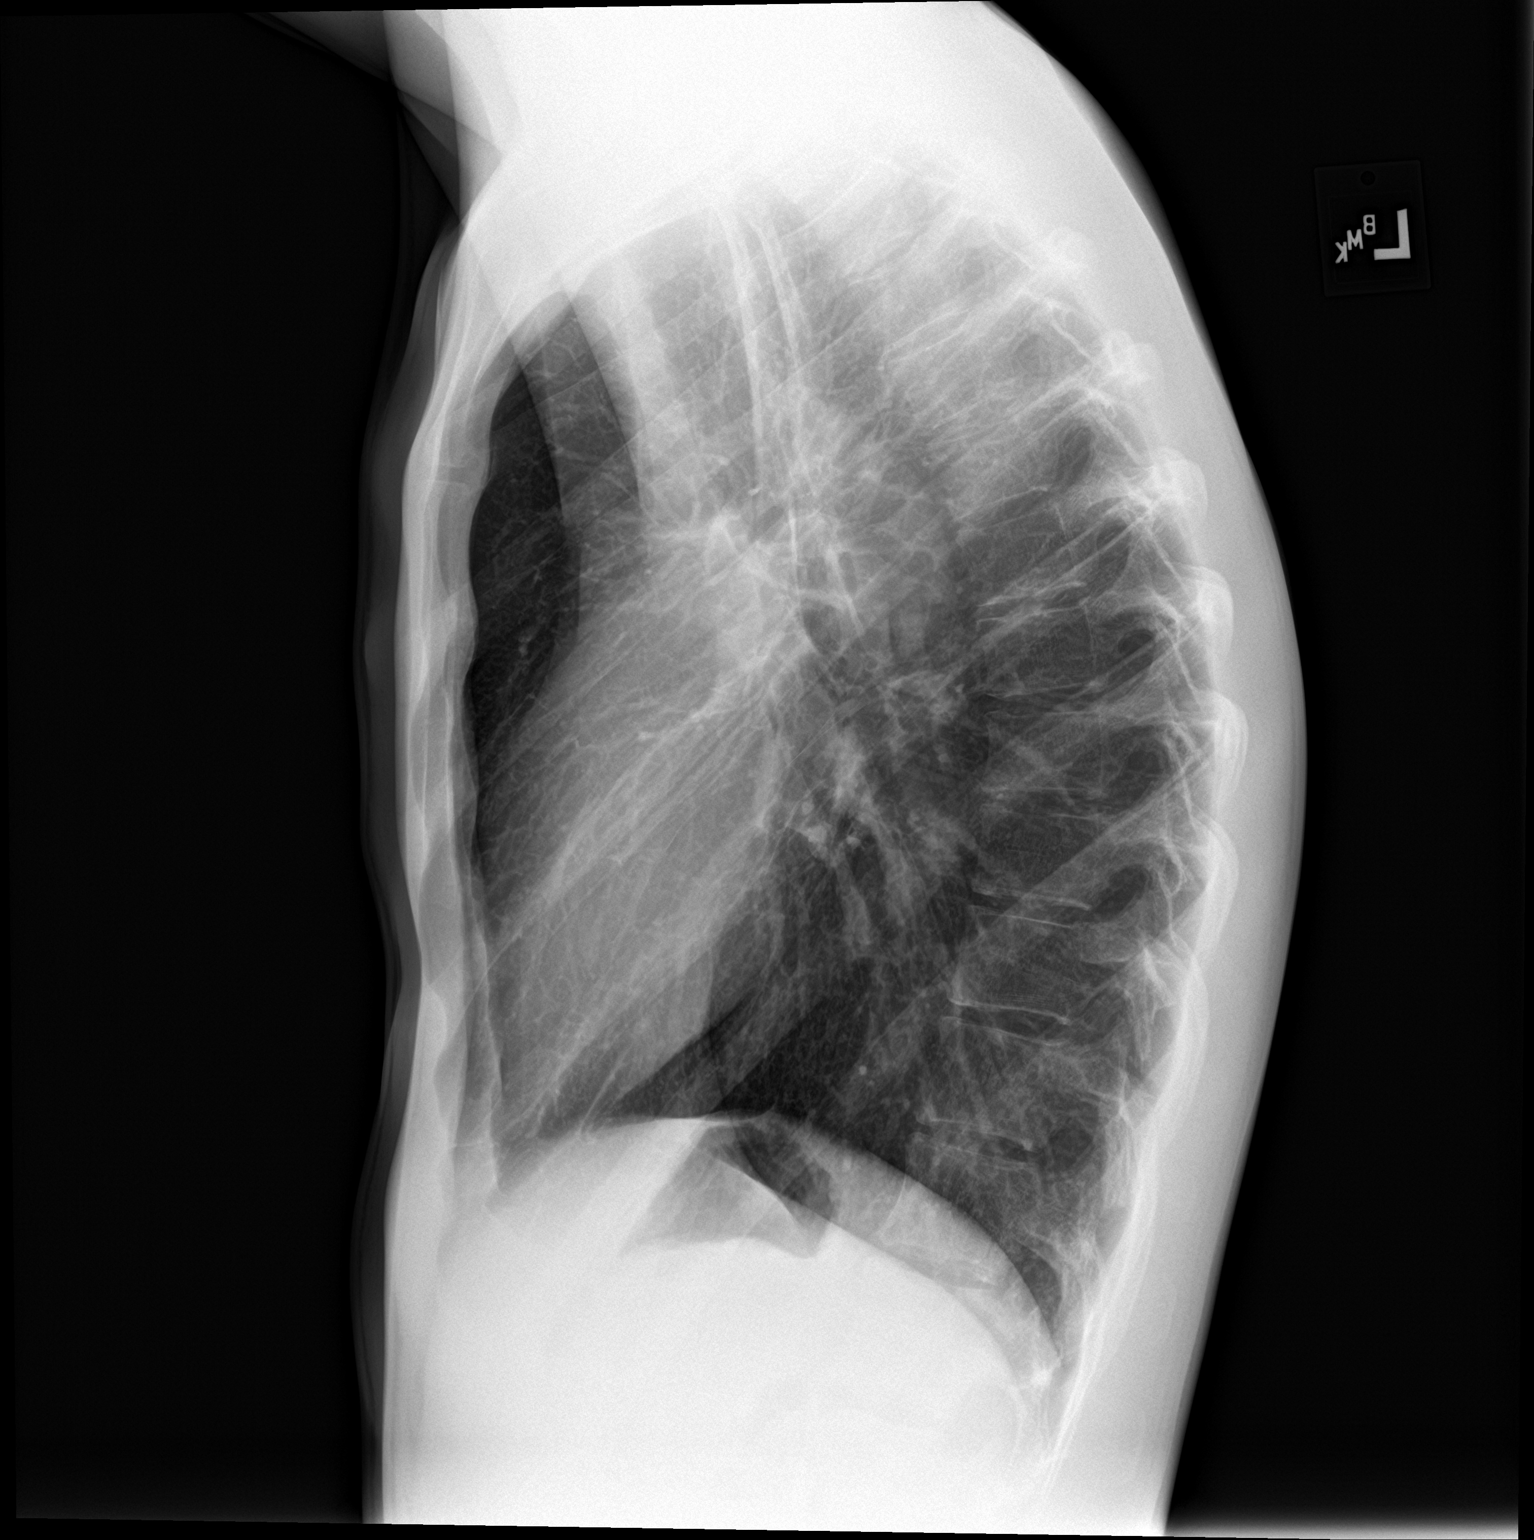

[2 of 2 positions shown; findings below may reference images not displayed]

FINDINGS: The cardiac and mediastinal silhouettes are stable in size and
contour, and remain within normal limits.

The lungs are normally inflated. No airspace consolidation, pleural
effusion, or pulmonary edema is identified. There is no
pneumothorax.

No acute osseous abnormality identified.
IMPRESSION: No active cardiopulmonary disease.
